# Patient Record
Sex: Male | Born: 2000
Health system: Southern US, Community
[De-identification: ages and names within clinical notes are randomized; demographics above are authoritative.]

## PROBLEM LIST (undated history)

## (undated) DIAGNOSIS — Z789 Other specified health status: Secondary | ICD-10-CM

## (undated) HISTORY — PX: TESTICLE SURGERY: SHX794

## (undated) HISTORY — DX: Other specified health status: Z78.9

---
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
## 2018-02-10 ENCOUNTER — Ambulatory Visit (INDEPENDENT_AMBULATORY_CARE_PROVIDER_SITE_OTHER): Payer: BLUE CROSS/BLUE SHIELD | Admitting: Podiatry

## 2018-02-10 ENCOUNTER — Encounter: Payer: Self-pay | Admitting: Podiatry

## 2018-02-10 DIAGNOSIS — M21962 Unspecified acquired deformity of left lower leg: Secondary | ICD-10-CM | POA: Diagnosis not present

## 2018-02-10 DIAGNOSIS — S93621A Sprain of tarsometatarsal ligament of right foot, initial encounter: Secondary | ICD-10-CM | POA: Diagnosis not present

## 2018-02-10 DIAGNOSIS — M21961 Unspecified acquired deformity of right lower leg: Secondary | ICD-10-CM

## 2018-02-10 NOTE — Patient Instructions (Signed)
Seen for pain in right mid foot area that started 6 weeks ago. X-ray finding is normal on right, left side has elevated first metatarsal bone. Both ankle joints inverts excessively. Both first metatarsal bone gets lifted up ward direction with full weight bearing. Assessment: Possible Lisfranc joint injury involving the first and 2nd MCJ right. Will add stabilizing metatarsal binder and ankle brace for weight bearing activity. May increase activity as tolerated. Return on 02/23/18. Metatarsal binder dispensed x 1.

## 2018-02-10 NOTE — Progress Notes (Signed)
SUBJECTIVE: 17 y.o. year old male presents stating that right foot has something moving around. Also gives pain when lift up and walk around. Pain is at about 1st and 2nd MCJ of right foot area. Pain radiates to plantar arch area. Bee doing a lot of running. Every other day running 5-8 miles for the last 6 weeks. Used to have tendonitis at age 24 on left side after a sprain. He works now but getting reay to go on a [employer] camp for 8 weeks that starts in 2 weeks.  Review of Systems  Constitutional: Negative.   HENT: Negative.   Eyes: Negative.   Respiratory: Negative.   Cardiovascular: Negative.   Gastrointestinal: Negative.   Genitourinary: Negative.   Musculoskeletal: Negative.   Skin: Negative.     OBJECTIVE: DERMATOLOGIC EXAMINATION: Normal findings.  VASCULAR EXAMINATION OF LOWER LIMBS: All pedal pulses are palpable with normal pulsation.  No edema or erythema noted.  Temperature gradient from tibial crest to dorsum of foot is within normal bilateral.  NEUROLOGIC EXAMINATION OF THE LOWER LIMBS: All epicritic and tactile sensations grossly intact. Sharp and Dull discriminatory sensations at the plantar ball of hallux is intact bilateral.   MUSCULOSKELETAL EXAMINATION: Positive for excess rearfoot varus bilateral. Excess first ray sagittal plane motion with weight bearing L>R. Pain at the first and 2nd MCJ area right is stood on the metatarsophalangeal joint with heel lift.  RADIOGRAPHIC STUDIES:  AP View:  First metatarsal length -3 bilateral. Fibular sesamoid position at 4 on right, 3 on left. Increased lateral deviation angle of CCJ bilateral.  Contracted lesser digits at DIPJ 3rd through 5th right.  Lateral view:  High arched foot with dorsal elevation of the first ray L>R.. Dorsally shifted first ray left. No other acute changes seen at the affected medial Lisfranc joint area. Positive for Os trigonum bilateral.  ASSESSMENT: Strained Lisfranc joint  ligaments involving the first and 2nd MCJ right foot. Hypermobile first ray bilateral. Compensated rearfoot varus bilateral.  PLAN: Reviewed findings and available treatment options. Metatarsal binder large dispensed to add stability of the mid foot right. Ankle brace dispensed and fitted to right foot to reduce excess pronatory force on right foot. May continue weight bearing and ambulation as tolerated while using the ankle brace. May return in 2 weeks for final check before going off for basic training.

## 2018-02-23 ENCOUNTER — Ambulatory Visit: Payer: BLUE CROSS/BLUE SHIELD | Admitting: Podiatry

## 2018-08-04 ENCOUNTER — Ambulatory Visit (INDEPENDENT_AMBULATORY_CARE_PROVIDER_SITE_OTHER): Payer: BLUE CROSS/BLUE SHIELD | Admitting: Internal Medicine

## 2018-08-04 ENCOUNTER — Encounter: Payer: Self-pay | Admitting: Internal Medicine

## 2018-08-04 ENCOUNTER — Ambulatory Visit: Payer: Self-pay | Admitting: Internal Medicine

## 2018-08-04 VITALS — BP 118/72 | HR 77 | Temp 98.2°F | Resp 16 | Ht 70.0 in | Wt 184.0 lb

## 2018-08-04 DIAGNOSIS — L03011 Cellulitis of right finger: Secondary | ICD-10-CM | POA: Diagnosis not present

## 2018-08-04 DIAGNOSIS — Z23 Encounter for immunization: Secondary | ICD-10-CM

## 2018-08-04 MED ORDER — DOXYCYCLINE HYCLATE 100 MG PO TABS: 100.0000 mg | ORAL_TABLET | Freq: Two times a day (BID) | ORAL | 0 refills | Status: AC

## 2018-08-04 NOTE — Patient Instructions (Signed)
Keep the area clean and dry  Cover with an antibiotic ointment  Take doxycycline, an antibiotic, for 1 week.  Call if not gradually better  Call if you have fever, chills or the redness extends.  Try to keep the hand elevated to decrease the pressure   Paronychia Paronychia is an infection of the skin. It happens near a fingernail or toenail. It may cause pain and swelling around the nail. In some cases, a fluid-filled bump (abscess) can form near or under the nail. Usually, this condition is not serious, and it clears up with treatment. Follow these instructions at home: Wound care  Keep the affected area clean.  Soak the fingers or toes in warm water as told by your doctor. You may be told to do this for 20 minutes, 2-3 times a day.  Keep the area dry when you are not soaking it.  Do not try to drain a fluid-filled bump on your own.  Follow instructions from your doctor about how to take care of the affected area. Make sure you: ? Wash your hands with soap and water before you change your bandage (dressing). If you cannot use soap and water, use hand sanitizer. ? Change your bandage as told by your doctor.  If you had a fluid-filled bump and your doctor drained it, check the area every day for signs of infection. Check for: ? Redness, swelling, or pain. ? Fluid or blood. ? Warmth. ? Pus or a bad smell. Medicines   Take over-the-counter and prescription medicines only as told by your doctor.  If you were prescribed an antibiotic medicine, take it as told by your doctor. Do not stop taking it even if you start to feel better. General instructions  Avoid touching any chemicals.  Do not pick at the affected area. Prevention  To prevent this condition from happening again: ? Wear rubber gloves when putting your hands in water for washing dishes or other tasks. ? Wear gloves if your hands might touch cleaners or chemicals. ? Avoid injuring your nails or  fingertips. ? Do not bite your nails or tear hangnails. ? Do not cut your nails very short. ? Do not cut the skin at the base and sides of the nail (cuticles). ? Use clean nail clippers or scissors when trimming nails. Contact a doctor if:  You feel worse.  You do not get better.  You have more fluid, blood, or pus coming from the affected area.  Your finger or knuckle is swollen or is hard to move. Get help right away if you have:  A fever or chills.  Redness spreading from the affected area.  Pain in a joint or muscle. Summary  Paronychia is an infection of the skin. It happens near a fingernail or toenail.  This condition may cause pain and swelling around the nail.  Soak the fingers or toes in warm water as told by your doctor.  Usually, this condition is not serious, and it clears up with treatment. This information is not intended to replace advice given to you by your health care provider. Make sure you discuss any questions you have with your health care provider. Document Released: 05/08/2009 Document Revised: 06/02/2017 Document Reviewed: 06/02/2017 Elsevier Interactive Patient Education  2019 [employer].

## 2018-08-04 NOTE — Progress Notes (Signed)
Pre visit review using our clinic review tool, if applicable. No additional management support is needed unless otherwise documented below in the visit note. 

## 2018-08-04 NOTE — Progress Notes (Signed)
Subjective:    Patient ID: Steven Ware, male    DOB: [birth year], 18 y.o.   MRN: [id number]  DOS:  08/04/2018 Type of visit - description: New patient, acute visit  Symptoms started approximately 5 days ago with swelling at the right. The area is getting gradually more swollen are not painful. Has taking ibuprofen with some help. He works at a [employer] but does not recall any injury.   Review of Systems No fever chills Has not seen any discharge  Past Medical History:  Diagnosis Date  . No pertinent past medical history     Past Surgical History:  Procedure Laterality Date  . TESTICLE SURGERY     undescended testicle, age 41    Social History   Socioeconomic History  . Marital status: Single    Spouse name: Not on file  . Number of children: Not on file  . Years of education: Not on file  . Highest education level: Not on file  Occupational History  . Occupation: works @ the [employer]   Social Needs  . Financial resource strain: Not on file  . Food insecurity:    Worry: Not on file    Inability: Not on file  . Transportation needs:    Medical: Not on file    Non-medical: Not on file  Tobacco Use  . Smoking status: Never Smoker  . Smokeless tobacco: Current User  Substance and Sexual Activity  . Alcohol use: Not Currently  . Drug use: Not on file  . Sexual activity: Not on file  Lifestyle  . Physical activity:    Days per week: Not on file    Minutes per session: Not on file  . Stress: Not on file  Relationships  . Social connections:    Talks on phone: Not on file    Gets together: Not on file    Attends religious service: Not on file    Active member of club or organization: Not on file    Attends meetings of clubs or organizations: Not on file    Relationship status: Not on file  . Intimate partner violence:    Fear of current or ex partner: Not on file    Emotionally abused: Not on file    Physically abused: Not on file      Forced sexual activity: Not on file  Other Topics Concern  . Not on file  Social History Narrative  . Not on file      Allergies as of 08/04/2018   No Known Allergies     Medication List       Accurate as of August 04, 2018 11:59 PM. Always use your most recent med list.        doxycycline 100 MG tablet Commonly known as:  VIBRA-TABS Take 1 tablet (100 mg total) by mouth 2 (two) times daily.      Family History  Problem Relation Age of Onset  . Heart disease Paternal Grandfather   . Cancer Neg Hx          Objective:   Physical Exam BP 118/72 (BP Location: Left Arm, Patient Position: Sitting, Cuff Size: Small)   Pulse 77   Temp 98.2 F (36.8 C) (Oral)   Resp 16   Ht 5\' 10"  (1.778 m)   Wt 184 lb (83.5 kg)   SpO2 98%   BMI 26.40 kg/m  General:   Well developed, NAD, BMI noted. HEENT:  Normocephalic . Face  symmetric, atraumatic MSK: Right thumb is swollen, the pulp is soft and not tender.  See picture Right finger is neurologic:  alert & oriented X3.  Speech normal, gait appropriate for age and unassisted Psych--  Cognition and judgment appear intact.  Cooperative with normal attention span and concentration.  Behavior appropriate. No anxious or depressed appearing.        Assessment     17 year old male, healthy.  Presents with Paronychia: The area was I&D, culture sent, start doxycycline, call if not better.  He works at [employer], work note for today and tomorrow, after that needs strict precautions until the area is healed to prevent contamination.  See AVS.  Procedure note: In a sterile fashion, I put local anesthesia on top of the infected area on the right thumb with less than half cc of lidocaine 2% w/o.  Then I did a 3 mm incision with a blade.  White discharge noted and some blood. Culture sent.

## 2018-08-05 ENCOUNTER — Encounter: Payer: Self-pay | Admitting: Internal Medicine

## 2018-08-07 LAB — WOUND CULTURE
MICRO NUMBER: 269624
SPECIMEN QUALITY: ADEQUATE

## 2018-08-21 ENCOUNTER — Ambulatory Visit: Payer: Self-pay | Admitting: Family Medicine
